# Patient Record
Sex: Female | Born: 1970 | Hispanic: Yes | Marital: Married | Smoking: Never smoker
Health system: Southern US, Community
[De-identification: ages and names within clinical notes are randomized; demographics above are authoritative.]

## PROBLEM LIST (undated history)

## (undated) HISTORY — PX: TUBAL LIGATION: SHX77

---

## 2013-08-27 ENCOUNTER — Encounter (HOSPITAL_COMMUNITY): Payer: Self-pay | Admitting: Emergency Medicine

## 2013-08-27 ENCOUNTER — Emergency Department (HOSPITAL_COMMUNITY)
Admission: EM | Admit: 2013-08-27 | Discharge: 2013-08-27 | Disposition: A | Discharge status: Home / Self Care | Payer: Self-pay | Attending: Emergency Medicine | Admitting: Emergency Medicine

## 2013-08-27 DIAGNOSIS — N644 Mastodynia: Secondary | ICD-10-CM | POA: Insufficient documentation

## 2013-08-27 MED ORDER — CEPHALEXIN 500 MG PO CAPS: 500.0000 mg | ORAL_CAPSULE | Freq: Four times a day (QID) | ORAL | Status: AC

## 2013-08-27 MED ORDER — IBUPROFEN 800 MG PO TABS: 800.0000 mg | ORAL_TABLET | Freq: Three times a day (TID) | ORAL | Status: AC

## 2013-08-27 MED ORDER — HYDROCODONE-ACETAMINOPHEN 5-325 MG PO TABS: 1.0000 | ORAL_TABLET | ORAL | Status: AC | PRN

## 2013-08-27 NOTE — ED Provider Notes (Signed)
Medical screening examination/treatment/procedure(s) were performed by non-physician practitioner and as supervising physician I was immediately available for consultation/collaboration.   EKG Interpretation None        Forrest S Harrison, MD 08/27/13 1918 

## 2013-08-27 NOTE — ED Provider Notes (Signed)
CSN: [id number]     Arrival date & time 08/27/13  1245 History  This chart was scribed for non-physician practitioner, Fayrene HelperBowie Tran, PA-C working with Junius ArgyleForrest S Harrison, MD by Greggory StallionKayla Andersen, ED scribe. This patient was seen in room TR10C/TR10C and the patient's care was started at 1:18 PM.   Chief Complaint  Patient presents with  . Breast Problem   The history is provided by the patient. A language interpreter was used (pt's daughter).   HPI Comments: Sophia DressMaria Williams is a [age] y.o. female who presents to the Emergency Department complaining of a lump to her right breast that started 3 days ago. States she is having burning pain around the area and reports that it is warm to touch. Denies drainage from the area. Pt is currently on her menstrual cycle and states she normally gets breast pain with it but this is not similar. States it is not normally this hard. She has taken ibuprofen with no relief. Denies any insect bites. Denies fever, chills, trouble breathing, nipple discharge. Denies smoking history.   History reviewed. No pertinent past medical history. Past Surgical History  Procedure Laterality Date  . Tubal ligation     History reviewed. No pertinent family history. History  Substance Use Topics  . Smoking status: Never Smoker   . Smokeless tobacco: Not on file  . Alcohol Use: Yes   OB History   Grav Para Term Preterm Abortions TAB SAB Ect Mult Living                 Review of Systems  Constitutional: Negative for fever and chills.  Musculoskeletal:       Breast pain.  All other systems reviewed and are negative.  Allergies  Review of patient's allergies indicates no known allergies.  Home Medications   Prior to Admission medications   Not on File   BP 113/74  Pulse 74  Temp(Src) 98.3 F (36.8 C)  Resp 18  SpO2 99%  LMP 08/27/2013  Physical Exam  Nursing note and vitals reviewed. Constitutional: She is oriented to person, place, and time. She appears  well-developed and well-nourished. No distress.  HENT:  Head: Normocephalic and atraumatic.  Eyes: Conjunctivae and EOM are normal.  Neck: Neck supple. No tracheal deviation present.  Cardiovascular: Normal rate, regular rhythm and normal heart sounds.   Pulmonary/Chest: Effort normal and breath sounds normal. No respiratory distress. She has no wheezes. She has no rales.  Musculoskeletal: Normal range of motion.  Right breast with mild erythema noted to the lateral aspect without any distinct margin. Tender to palpation. Firm nodule noted in breast tissue. No nipple discharge. No peau d'orange.   Lymphadenopathy:  No axillary lymphadenopathy.  Neurological: She is alert and oriented to person, place, and time.  Skin: Skin is warm and dry.  Psychiatric: She has a normal mood and affect. Her behavior is normal.    ED Course  Procedures (including critical care time)  DIAGNOSTIC STUDIES: Oxygen Saturation is 99% on RA, normal by my interpretation.    COORDINATION OF CARE: 1:23 PM-Discussed treatment plan which includes an antibiotic and pain medication with pt at bedside and pt agreed to plan. Advised pt to follow up with the [facility] to get an ultrasound done. Suspect fibroadenoma or breast cystic lesion.   Labs Review Labs Reviewed - No data to display  Imaging Review No results found.   EKG Interpretation None      MDM   Final diagnoses:  Breast pain,  right    BP 113/74  Pulse 74  Temp(Src) 98.3 F (36.8 C)  Resp 18  SpO2 99%  LMP 08/27/2013   I personally performed the services described in this documentation, which was scribed in my presence. The recorded information has been reviewed and is accurate.  Fayrene HelperBowie Tran, PA-C 08/27/13 1328

## 2013-08-27 NOTE — ED Notes (Signed)
Area on rt breast that is swollen and red since  Friday no injury  May have been bigger over the weekend but now is smaller . She is not nursing

## 2013-08-27 NOTE — Discharge Instructions (Signed)
Please follow up at breast center clinic for breast ultrasound for further evaluation of your breast pain. Take antibiotic only if you develop worsening redness, fever, rash or other signs of infection.   Sensibilidad en las mamas (Breast Tenderness) La sensibilidad en las mamas es un problema frecuente en las mujeres de todas las edades. y puede causar molestias leves o dolor intenso. Sus causas son variadas. Su mdico determinar la causa probable de la sensibilidad Education administratormediante el examen de las Ramseymamas, las preguntas United Stationerssobre los sntomas y la indicacin de algunos estudios. Por lo general, la sensibilidad en las mamas no significa que tenga cncer de mama. INSTRUCCIONES PARA EL CUIDADO EN EL HOGAR  A menudo, la sensibilidad en las mamas puede tratarse en Advice workerel hogar. Puede intentar lo siguiente:  Probarse un nuevo sostn que le brinde ms sujecin, especialmente mientras hace actividad fsica.  Usar un sostn con mejor sujecin o uno deportivo mientras duerme cuando las mamas estn muy sensibles.  Si tiene una lesin mamaria, aplique hielo en la zona:  Ponga el hielo en una bolsa plstica.  Colquese una toalla entre la piel y la bolsa de hielo.  Deje el hielo durante 20 minutos y aplquelo 2 a 3 veces por da.  Si tiene las Energy East Corporationmamas repletas de Gene Autryleche debido a la Market researcherlactancia, intente lo siguiente:  Extrigase United Stationersleche manualmente o con un sacaleche.  Aplquese una compresa tibia en las mamas para ayudar a Manufacturing systems engineerla descarga.  Tome analgsicos de venta libre si su mdico lo autoriza.  Tome otros medicamentos que su mdico le recete, entre ellos, antibiticos o anticonceptivos. A largo plazo, puede aliviar la sensibilidad en las mamas si hace lo siguiente:  Disminuye el consumo de cafena.  Disminuye la cantidad de grasa de la dieta. Lleva un registro de [address] das y las horas cuando tiene mayor sensibilidad en las Motleymamas. Esto ser de ayuda para que usted y su mdico encuentren la causa de la sensibilidad y  cmo Runner, broadcasting/film/videoaliviarla. Adems, aprenda cmo examinarse las mamas en casa. Esto la ayudar a palpar un crecimiento o un bulto fuera de lo normal que podra causar la sensibilidad. SOLICITE ATENCIN MDICA SI:   Cualquier zona de la mama est dura, enrojecida y caliente al tacto. Puede ser un signo de infeccin.  Hay secrecin de los pezones (y no est amamantando). En especial, vigile la secrecin de sangre o pus.  Tiene fiebre, adems de sensibilidad en las mamas.  Tiene un bulto nuevo o doloroso en la mama que no desaparece despus de la finalizacin del perodo menstrual.  Ha intentando controlar el dolor en casa, pero no desaparece.  El dolor de la mama es ms intenso o le dificulta hacer las cosas que hace habitualmente durante el da. Document Released: 12/13/2012 [facility] Patient Information 2015 [place], [place]. This information is not intended to replace advice given to you by your health care provider. Make sure you discuss any questions you have with your health care provider.

## 2013-08-27 NOTE — ED Notes (Signed)
Pt has had lump on breast with worsening pain since Friday. sts right breast. Denies drainage.

## 2013-08-31 ENCOUNTER — Other Ambulatory Visit: Payer: Self-pay

## 2013-09-04 ENCOUNTER — Other Ambulatory Visit (HOSPITAL_COMMUNITY): Payer: Self-pay | Admitting: General Surgery

## 2013-09-04 DIAGNOSIS — N63 Unspecified lump in unspecified breast: Secondary | ICD-10-CM

## 2013-09-10 ENCOUNTER — Other Ambulatory Visit (HOSPITAL_COMMUNITY): Payer: Self-pay | Admitting: *Deleted

## 2013-09-10 DIAGNOSIS — N63 Unspecified lump in unspecified breast: Secondary | ICD-10-CM

## 2013-09-11 ENCOUNTER — Ambulatory Visit (HOSPITAL_COMMUNITY)
Admission: RE | Admit: 2013-09-11 | Discharge: 2013-09-11 | Disposition: A | Discharge status: Home / Self Care | Payer: PRIVATE HEALTH INSURANCE | Source: Ambulatory Visit | Attending: General Surgery | Admitting: General Surgery

## 2013-09-11 ENCOUNTER — Ambulatory Visit (HOSPITAL_COMMUNITY)
Admission: RE | Admit: 2013-09-11 | Discharge: 2013-09-11 | Disposition: A | Discharge status: Home / Self Care | Payer: PRIVATE HEALTH INSURANCE | Source: Ambulatory Visit | Attending: *Deleted | Admitting: *Deleted

## 2013-09-11 ENCOUNTER — Other Ambulatory Visit (HOSPITAL_COMMUNITY): Payer: Self-pay | Admitting: *Deleted

## 2013-09-11 DIAGNOSIS — N644 Mastodynia: Secondary | ICD-10-CM | POA: Insufficient documentation

## 2013-09-11 DIAGNOSIS — N63 Unspecified lump in unspecified breast: Secondary | ICD-10-CM

## 2013-09-12 ENCOUNTER — Other Ambulatory Visit (HOSPITAL_COMMUNITY): Payer: Self-pay | Admitting: *Deleted

## 2013-09-12 DIAGNOSIS — R229 Localized swelling, mass and lump, unspecified: Principal | ICD-10-CM

## 2013-09-12 DIAGNOSIS — IMO0002 Reserved for concepts with insufficient information to code with codable children: Secondary | ICD-10-CM

## 2013-09-25 ENCOUNTER — Ambulatory Visit (HOSPITAL_COMMUNITY)
Admission: RE | Admit: 2013-09-25 | Discharge: 2013-09-25 | Disposition: A | Discharge status: Home / Self Care | Payer: PRIVATE HEALTH INSURANCE | Source: Ambulatory Visit | Attending: *Deleted | Admitting: *Deleted

## 2013-09-25 ENCOUNTER — Other Ambulatory Visit (HOSPITAL_COMMUNITY): Payer: Self-pay | Admitting: *Deleted

## 2013-09-25 DIAGNOSIS — R229 Localized swelling, mass and lump, unspecified: Secondary | ICD-10-CM

## 2013-09-25 DIAGNOSIS — N63 Unspecified lump in unspecified breast: Secondary | ICD-10-CM | POA: Insufficient documentation

## 2013-09-25 DIAGNOSIS — IMO0002 Reserved for concepts with insufficient information to code with codable children: Secondary | ICD-10-CM

## 2013-09-25 DIAGNOSIS — N6009 Solitary cyst of unspecified breast: Secondary | ICD-10-CM | POA: Insufficient documentation

## 2013-09-25 MED ORDER — LIDOCAINE HCL (PF) 2 % IJ SOLN
INTRAMUSCULAR | Status: AC
  Filled 2013-09-25: qty 10

## 2013-09-25 MED ORDER — LIDOCAINE HCL (PF) 2 % IJ SOLN: 10.0000 mL | Freq: Once | INTRAMUSCULAR | Status: DC

## 2013-09-25 NOTE — Discharge Instructions (Signed)
Biopsia de mama - Cuidados posteriores  (Breast Biopsy, Care After)  Siga estas instrucciones durante las prximas semanas. Estas indicaciones le proporcionan informacin general acerca de cmo deber cuidarse despus del procedimiento. El mdico tambin podr darle instrucciones ms especficas. El tratamiento ha sido planificado segn las prcticas mdicas actuales, pero en algunos casos pueden ocurrir problemas. Comunquese con el mdico si tiene algn problema o tiene preguntas despus del procedimiento. INSTRUCCIONES PARA EL CUIDADO EN EL HOGAR   Solo tome medicamentos de venta libre o recetados para Chief Technology Officerel dolor, Dentistmalestar o fiebre, segn las indicaciones del mdico.  No tome aspirina. Puede ocasionar hemorragias.  Mantenga las suturas (puntos) secos cuando se bae.  Proteja la zona de la biopsia. No deje que la zona se inflame.  Evite las actividades que podran tironear y abrir el sitio de la biopsia hasta que su mdico la autorice. Aqu se incluye elongacin, estiramientos, actividad fsica, deportes o levantar pesos de ms de 3 libras (1.3 kg).  Siga con su dieta habitual.  Use un buen sostn de soporte durante el tiempo que le indique su mdico.  Cambie los apsitos vendajes tal como le indic su mdico.  No beba alcohol si toma analgsicos.  Concurra puntualmente a las citas de control con el mdico. Consulte la fecha en que los resultados estarn disponibles. Asegrese de Starbucks Corporationobtener los resultados. SOLICITE ATENCIN MDICA SI:   Presenta enrojecimiento, hinchazn o aumento del dolor en el sitio de la biopsia.  Advierte un olor ftido que proviene del sitio de la biopsia o del vendaje.  El sitio d la biopsia se abre despus de que le han retirado los puntos (suturas), las grapas o la McKinneycinta adhesiva.  Tiene una erupcin.  Necesita medicamentos ms fuertes. SOLICITE ATENCIN MDICA DE INMEDIATO SI:   Tiene fiebre.  Aumenta el sangrado (ms de una pequea mancha) en el lugar  de la biopsia.  Tiene dificultad para respirar.  Tiene pus en el sitio de la biopsia. ASEGRESE DE QUE:   Comprende estas instrucciones.  Controlar su enfermedad.  Solicitar ayuda de inmediato si no mejora o si empeora. Document Released: 11/17/2011 [facility] Patient Information 2015 [place]ExitCare, [place]. This information is not intended to replace advice given to you by your health care provider. Make sure you discuss any questions you have with your health care provider. Breast Biopsy Care After These instructions give you information on caring for yourself after your procedure. Your doctor may also give you more specific instructions. Call your doctor if you have any problems or questions after your procedure. HOME CARE  Only take medicine as told by your doctor.  Do not take aspirin.  Keep your sutures (stitches) dry when bathing.  Protect the biopsy area. Do not let the area get bumped.  Avoid activities that could pull the biopsy site open until your doctor approves. This includes:  Stretching.  Reaching.  Exercise.  Sports.  Lifting more than 3lb.  Continue your normal diet.  Wear a good support bra for as long as told by your doctor.  Change any bandages (dressings) as told by your doctor.  Do not drink alcohol while taking pain medicine.  Keep all doctor visits as told. Ask when your test results will be ready. Make sure you get your test results. GET HELP RIGHT AWAY IF:   You have a fever.  You have more bleeding (more than a small spot) from the biopsy site.  You have trouble breathing.  You have yellowish-white fluid (pus) coming from  the biopsy site.  You have redness, puffiness (swelling), or more pain in the biopsy site.  You have a bad smell coming from the biopsy site.  Your biopsy site opens after sutures, staples, or sticky strips have been removed.  You have a rash.  You need stronger medicine. MAKE SURE YOU:  Understand these  instructions.  Will watch your condition.  Will get help right away if you are not doing well or get worse. Document Released: 12/19/2008 Document Revised: 05/17/2011 Document Reviewed: 04/04/2011 [facility] Patient Information 2015 [place], [place]. This information is not intended to replace advice given to you by your health care provider. Make sure you discuss any questions you have with your health care provider.

## 2014-11-28 IMAGING — US US BREAST LTD UNI RIGHT INC AXILLA
1 series · 10 of 10 positions shown · non-contrast
Comparison: None.

CLINICAL DATA: 42-year-old female with a palpable abnormality in
the outer right breast. The patient states that this area was
painful and swollen the mid and [DATE] in the patient had been
placed on a course of antibiotics. This abnormality reportedly
improved after the antibiotics, although the patient feels as if
this area may be worsening again.

EXAM:
DIGITAL DIAGNOSTIC  BILATERAL MAMMOGRAM WITH CAD
ULTRASOUND RIGHT BREAST

[Series 1: us breast ltd uni right inc axilla · 0.07mm/px · 10 of 10 slices shown]
[im 1/10]
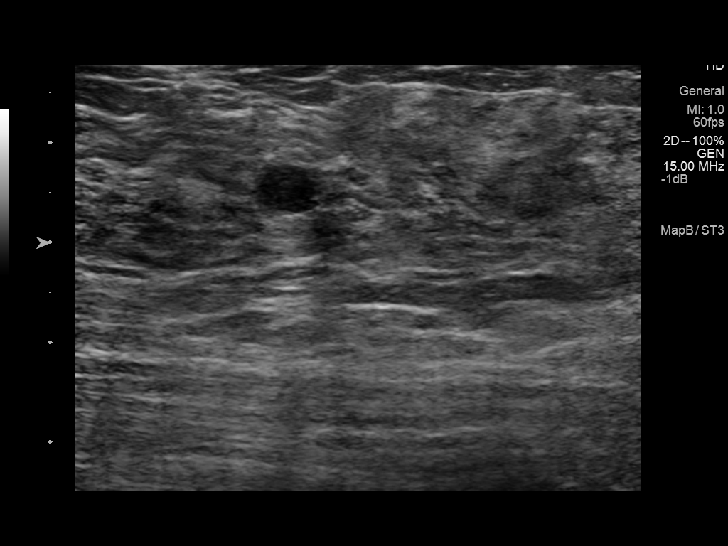
[im 2/10]
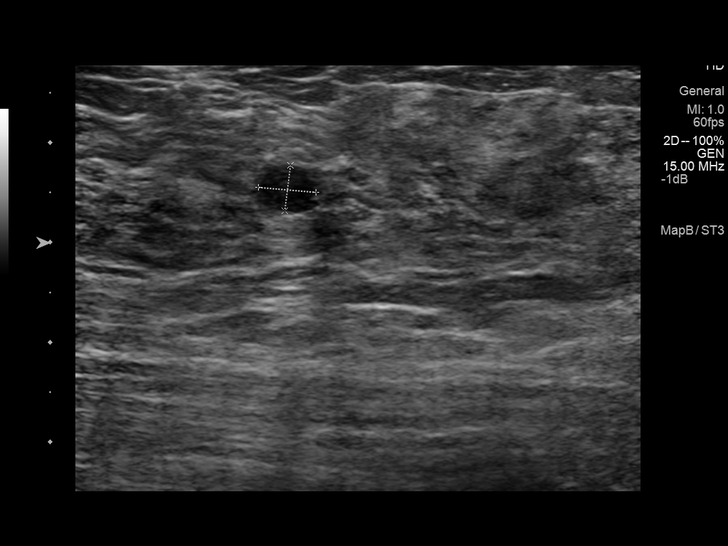
[im 3/10]
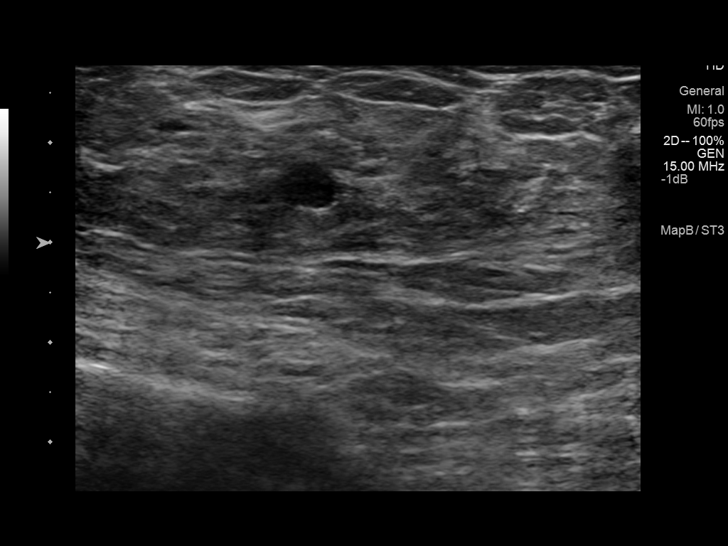
[im 4/10]
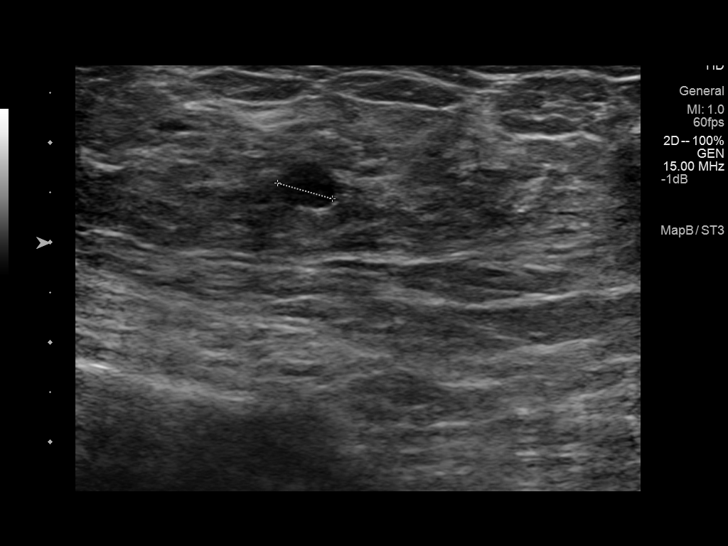
[im 5/10]
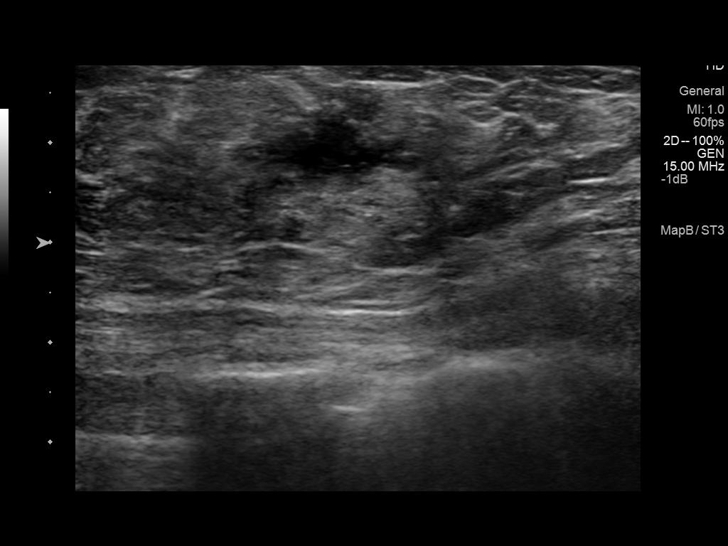
[im 6/10]
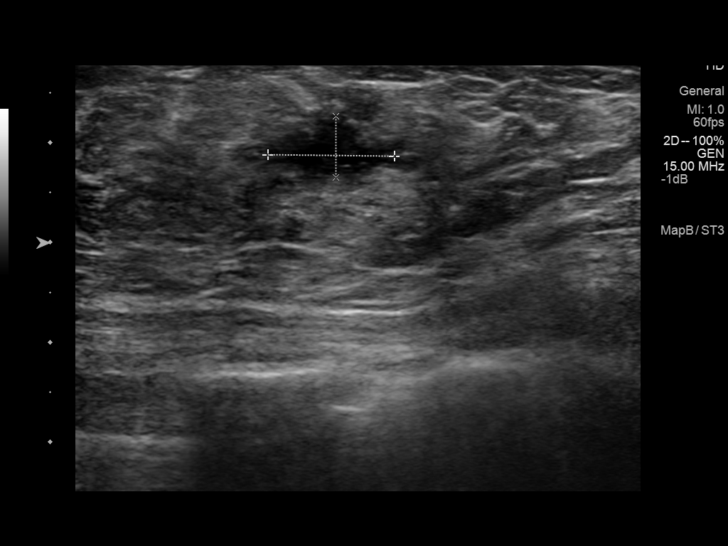
[im 7/10]
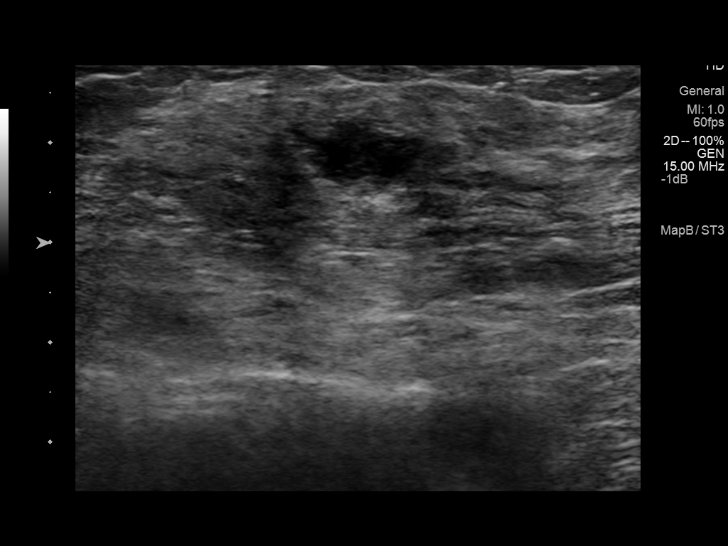
[im 8/10]
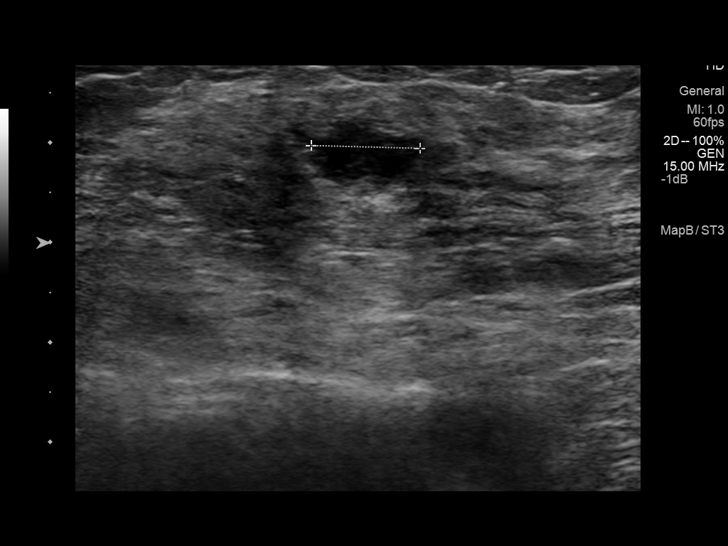
[im 9/10]
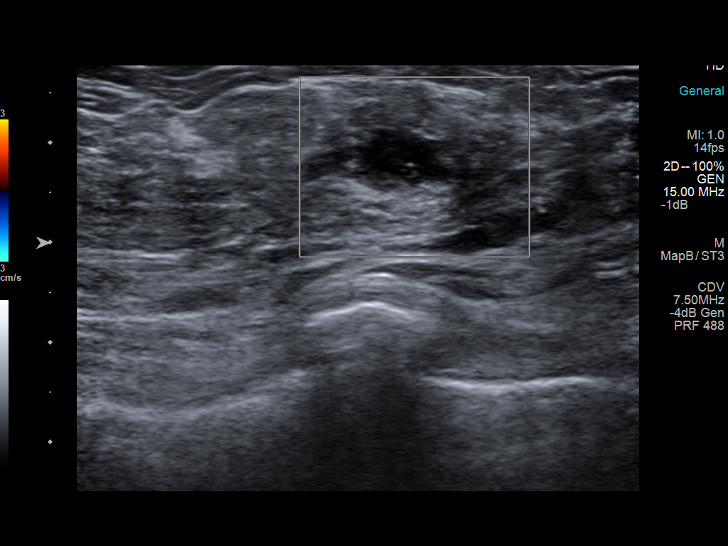
[im 10/10]
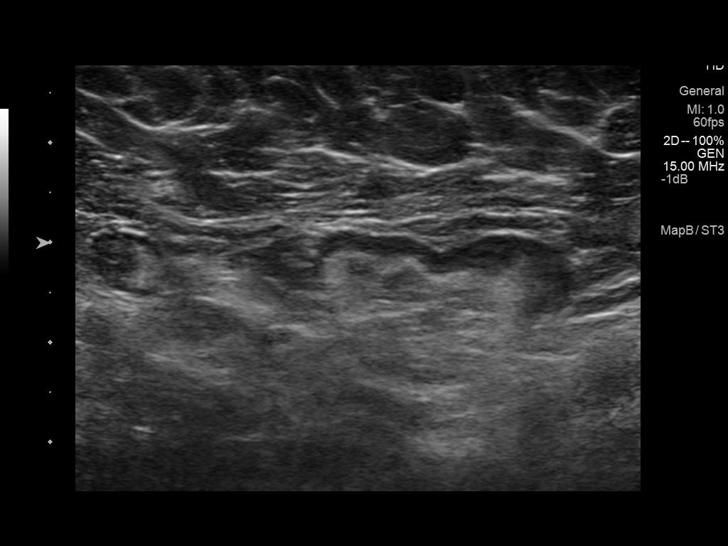

[10 of 10 positions shown; findings below may reference images not displayed]

ACR Breast Density Category c: The breast tissue is heterogeneously
dense, which may obscure small masses.
FINDINGS: No suspicious masses or calcifications are seen in either breast. A
spot compression tangential view was performed over the palpable
site of concern in the outer right breast, with no mammographic
abnormalities seen in this location although very dense tissue is
present which could obscure an abnormality.

Mammographic images were processed with CAD.

Physical examination at site of concern in the right breast reveals
a firm area of thickening at the approximate 9 o'clock position.

Targeted ultrasound of the right breast was performed demonstrating
irregular hypoechoic mass at 830 4 cm from the nipple measuring
x 0.6 x 1.3 cm. This could be related to resolving infection or
fibrocystic change.

Several small cysts were also seen in the outer right breast, with a
cyst at 834 cm the nipple measuring 0.6 x 0.5 x 0.6 cm. No
lymphadenopathy seen in the right axilla.
IMPRESSION: Indeterminate right breast mass at 830 4 cm from the nipple which
could represent resolving infection or fibrocystic change, however
this is irregular in appearance and therefore tissue sampling is
warranted.

RECOMMENDATION:
Ultrasound-guided biopsy of the irregular mass in the right breast
at 830 4 cm from the nipple is recommended. Biopsy of this mass was
offered for the patient today, however she states she would like to
hold off on scheduling a biopsy as she will return to the Health
Department and subsequently be seen by a surgeon.

I have discussed the findings and recommendations with the patient.
Results were also provided in writing at the conclusion of the
visit. If applicable, a reminder letter will be sent to the patient
regarding the next appointment.

BI-RADS CATEGORY  4: Suspicious.

## 2022-11-30 ENCOUNTER — Telehealth: Payer: Self-pay | Admitting: Hematology and Oncology
